# Patient Record
Sex: Female | Born: 1970 | Race: White | Hispanic: No | Marital: Married | Smoking: Former smoker
Health system: Southern US, Community
[De-identification: ages and names within clinical notes are randomized; demographics above are authoritative.]

---

## 2004-03-31 HISTORY — PX: PARTIAL HYSTERECTOMY: SHX80

## 2009-05-19 ENCOUNTER — Emergency Department (HOSPITAL_BASED_OUTPATIENT_CLINIC_OR_DEPARTMENT_OTHER): Admission: EM | Admit: 2009-05-19 | Discharge: 2009-05-19 | Payer: Self-pay | Admitting: Emergency Medicine

## 2009-05-19 ENCOUNTER — Ambulatory Visit: Payer: Self-pay | Admitting: Diagnostic Radiology

## 2009-05-24 ENCOUNTER — Emergency Department (HOSPITAL_BASED_OUTPATIENT_CLINIC_OR_DEPARTMENT_OTHER): Admission: EM | Admit: 2009-05-24 | Discharge: 2009-05-24 | Payer: Self-pay | Admitting: Emergency Medicine

## 2010-04-08 ENCOUNTER — Emergency Department (HOSPITAL_BASED_OUTPATIENT_CLINIC_OR_DEPARTMENT_OTHER)
Admission: EM | Admit: 2010-04-08 | Discharge: 2010-04-08 | Payer: Self-pay | Source: Home / Self Care | Admitting: Emergency Medicine

## 2010-06-20 LAB — PROTIME-INR
INR: 1.94 — ABNORMAL HIGH (ref 0.00–1.49)
Prothrombin Time: 22 seconds — ABNORMAL HIGH (ref 11.6–15.2)

## 2010-07-05 IMAGING — CT CT HEAD W/O CM
2 series · 16 of 30 positions shown, 18 images · non-contrast
Comparison: None.

CLINICAL DATA: Head injury, laceration

CT HEAD WITHOUT CONTRAST
TECHNIQUE: Contiguous axial images were obtained from the base of
the skull through the vertex without contrast.

[Series 2: head 4.8 h37s · axial · 0.44mm/px · z∈[-150,-36]mm · 8 of 32 slices shown, 10 images]
[im 4/32  brain]
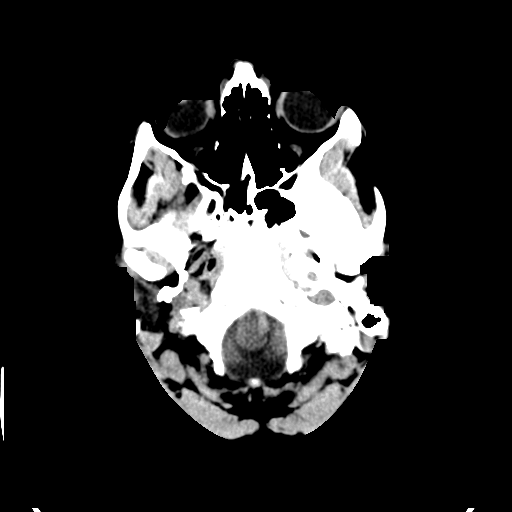
[im 4/32  bone]
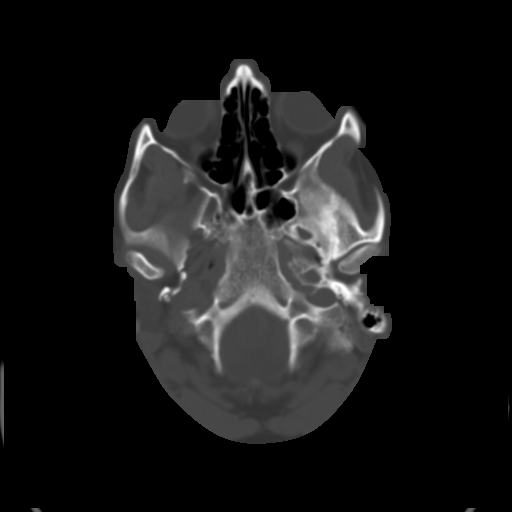
[im 7/32  brain]
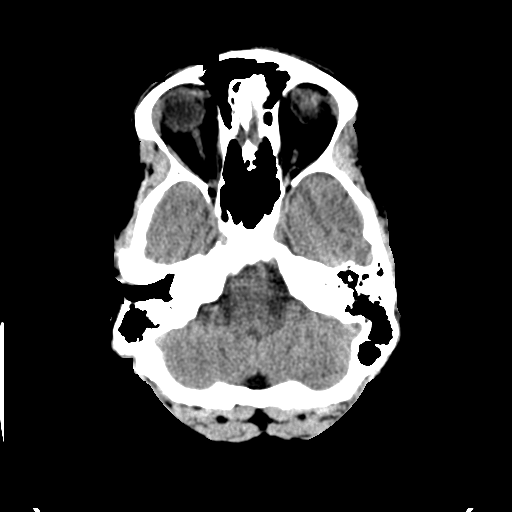
[im 11/32  brain]
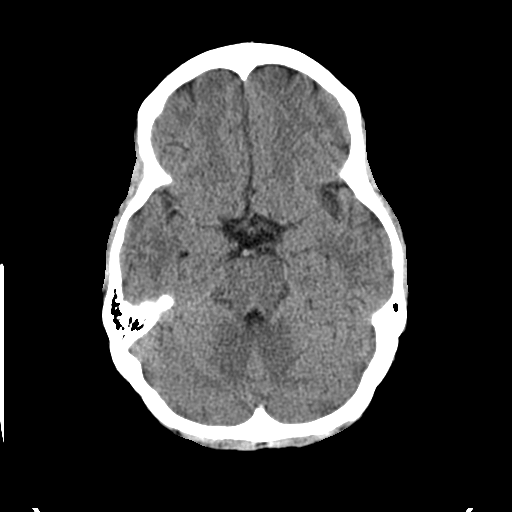
[im 14/32  brain]
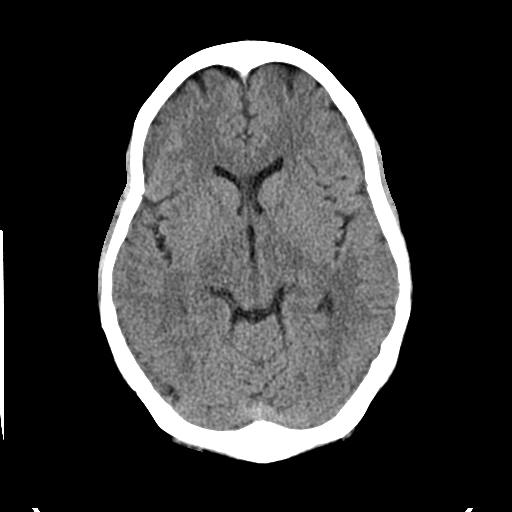
[im 18/32  brain]
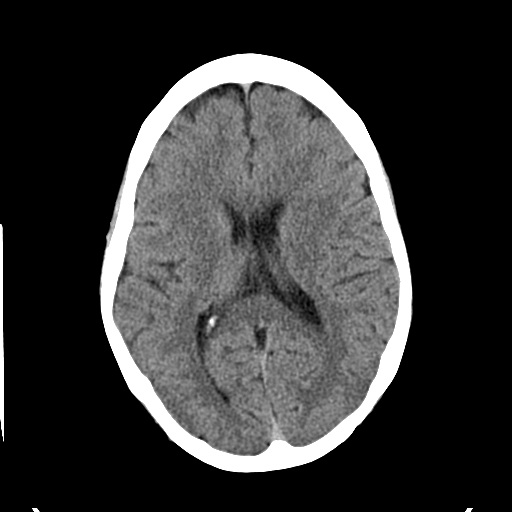
[im 18/32  bone]
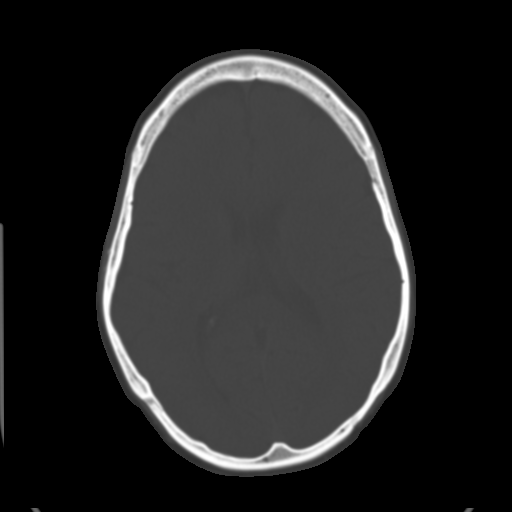
[im 21/32  brain]
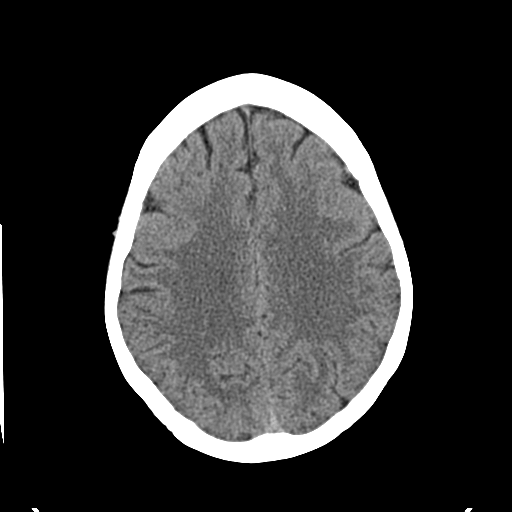
[im 25/32  brain]
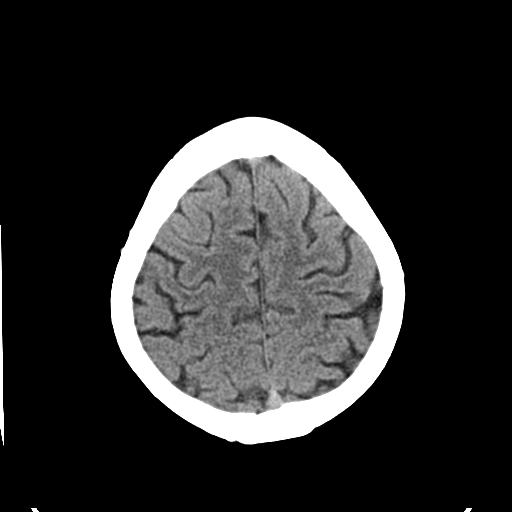
[im 28/32  brain]
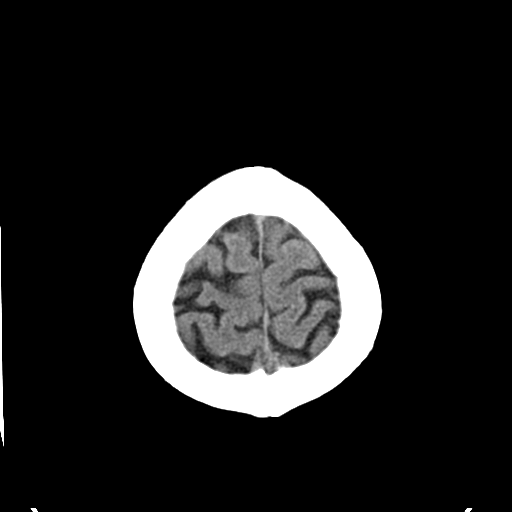

[Series 3: head 2.4 h60s bone · axial · 0.44mm/px · z∈[-151,-33]mm · 8 of 64 slices shown]
[im 7/64  bone]
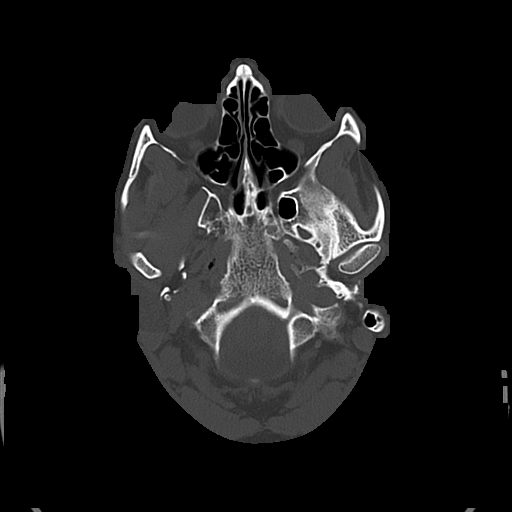
[im 14/64  bone]
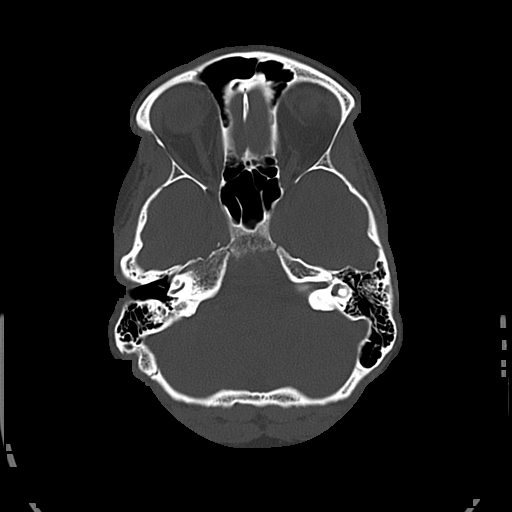
[im 20/64  bone]
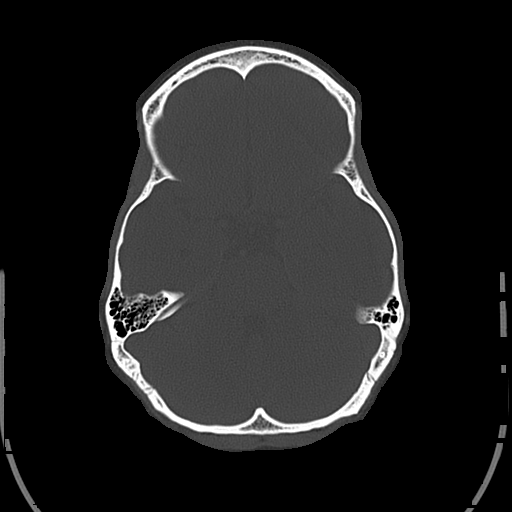
[im 27/64  bone]
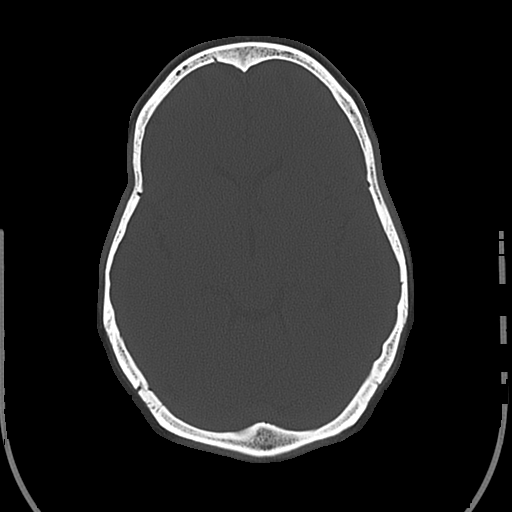
[im 37/64  bone]
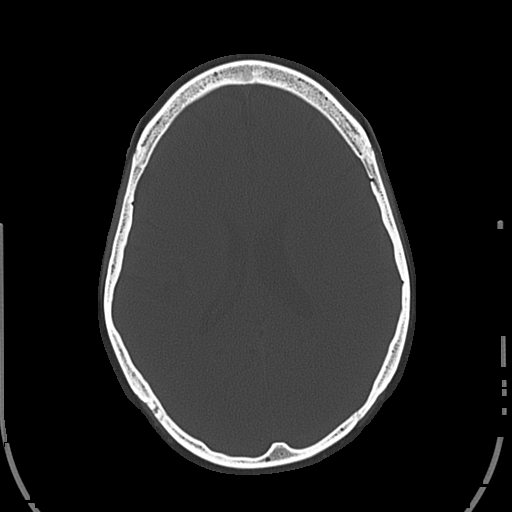
[im 44/64  bone]
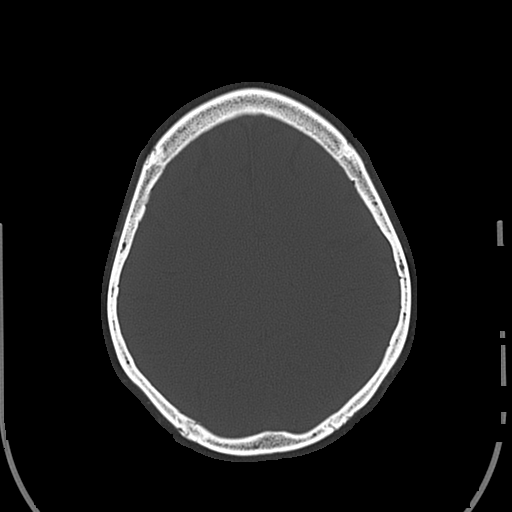
[im 50/64  bone]
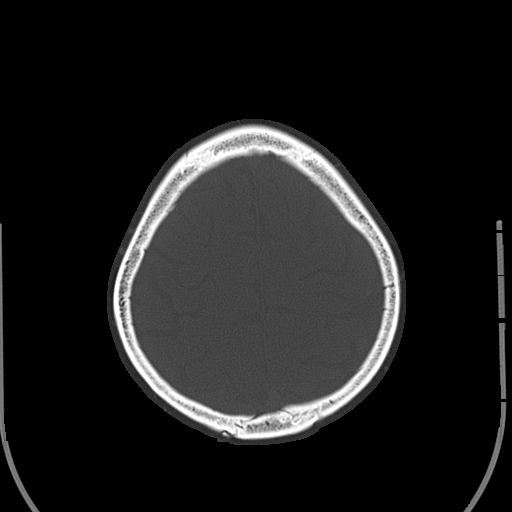
[im 57/64  bone]
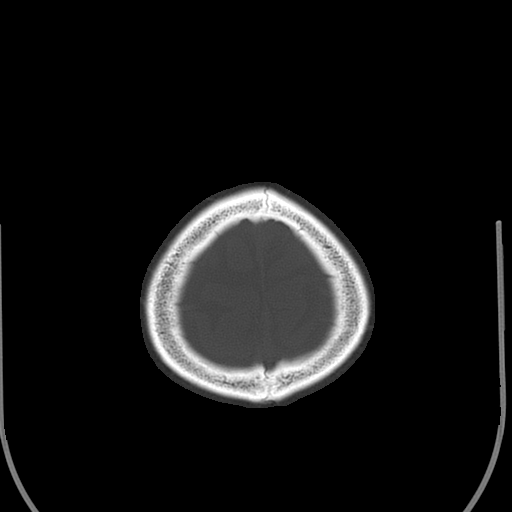

[16 of 30 positions shown; findings below may reference images not displayed]

FINDINGS: No acute intracranial hemorrhage.  No midline shift or
mass effect.  No hydrocephalus.  Bases are patent.

No evidence of skull fracture.  No evidence of significant scalp
hematoma.  Orbits are normal.  Paranasal sinuses mastoid air cells
are clear.
IMPRESSION: No evidence of intracranial trauma.

## 2021-11-20 NOTE — Progress Notes (Signed)
Cardiology Office Note:    Date:  11/22/2021   ID:  Jill Bryan, DOB 06-09-1970, MRN [id number]  PCP:  Earley Brooke., MD  Cardiologist:  Jodelle Red, MD  Referring MD: Earley Brooke., *   CC: new patient consultation for tachycardia  History of Present Illness:    Jill Bryan is a 51 y.o. female with a hx of tachycardia, hyperlipidemia, hyperparathyroidism, who is seen as a new consult at the request of Jill Bryan, Jill Bryan., * for the evaluation and management of tachycardia.  Referral notes from Dr. Ellie Bryan., personally reviewed.   She wore a cardiac event monitor, results reviewed.  She reports a prior thrombus in 2006 around the time of her hysterectomy. Hematology felt the clot was too large to have surgical etiology.  Tachycardia/palpitations: -Initial onset: 06/2021. Prior to that time, if her racing heart rates were occurring they were not noticeable. -Frequency/Duration: "All the time." Sometimes worse than other times. This morning she walked up the stairs and her Fit Bt recorded her heart rate at 147 bpm. She states her heart rate will spike with most daily activities, which is bothersome. Sometimes while sitting and watching TV her heart rate will begin racing for no reason. -Associated symptoms: Feels winded "any time I do anything." However, she would not describe this feeling as shortness of breath. -Aggravating/alleviating factors: More frequent and higher heart rate with dehydration. Typically she will stop and take deep breaths for a few minutes to calm her heart rate.  -Syncope/near syncope: None. -Prior cardiac history: None. -Prior workup: Completed treadmill stress test which was reportedly normal. Cardiac event monitor worn for 2 weeks, she has still not been able to obtain records. We reviewed the available images in the media tab. -Prior treatment: She is taking 50 mg metoprolol in the mornings. -Possible medication  interactions: Ibuprofen every once in awhile. -Caffeine: Maybe once a day, but not daily. -Alcohol:  2 glasses of wine per day during the week, up to 3 on the weekends.  -Tobacco:  Former social smoker. Quit 15 years ago. -Exercise level: Fairly active at work, Warehouse manager. No formal exercise routine. Most strenuous activity in the past month has been moving sofas, lifting objects.  -Labs: TSH, kidney function/electrolytes, CBC reviewed. -Cardiac ROS: no chest pain, no PND, no orthopnea, no LE edema. -Family history:  Her mother has hypertension, a prior stroke (49 yo) and aneurysm (49 yo); no treatment or surgery. Her father had a heart attack at 37 yo and had CABG x5 (almost 100% occlusion on all 5 blockages), and inclusive biomyositis. Her paternal grandparents died in their early mid 70's of heart attacks. Her sister is in good health.  She also endorses recent vision changes. She plans to see an ophthalmologist.  She denies any chest pain, or peripheral edema. No lightheadedness, headaches, syncope, orthopnea, or PND.  History reviewed. No pertinent past medical history.  Past Surgical History:  Procedure Laterality Date   PARTIAL HYSTERECTOMY  2006    Current Medications: Current Outpatient Medications on File Prior to Visit  Medication Sig   metoprolol succinate (TOPROL-XL) 50 MG 24 hr tablet Take 50 mg by mouth daily.   Vitamin D, Ergocalciferol, (DRISDOL) 1.25 MG (50000 UNIT) CAPS capsule Take 50,000 Units by mouth once a week.   No current facility-administered medications on file prior to visit.     Allergies:   Patient has no known allergies.   Social History   Tobacco Use  Smoking status: Former    Types: Cigarettes    Quit date: 2008    Years since quitting: 15.6   Smokeless tobacco: Never  Substance Use Topics   Alcohol use: Yes    Comment: socially   Drug use: Never    Family History: family history includes Aneurysm in her mother; Cirrhosis in her  maternal grandfather; Diabetes in her father; Emphysema in her maternal grandfather; Heart attack in her father, paternal grandfather, and paternal grandmother; Heart disease in her father; Hyperlipidemia in her father and mother; Hypertension in her father and mother; Stroke in her mother.  ROS:   Please see the history of present illness.  Additional pertinent ROS: Constitutional: Negative for chills, fever, night sweats, unintentional weight loss  HENT: Negative for ear pain and hearing loss.   Eyes: Negative for eye pain. Positive for vision changes.  Respiratory: Negative for cough, sputum, wheezing.   Cardiovascular: See HPI. Gastrointestinal: Negative for abdominal pain, melena, and hematochezia.  Genitourinary: Negative for dysuria and hematuria.  Musculoskeletal: Negative for falls and myalgias.  Skin: Negative for itching and rash.  Neurological: Negative for focal weakness, focal sensory changes and loss of consciousness.  Endo/Heme/Allergies: Does not bleed easily. Positive for bruising easily     EKGs/Labs/Other Studies Reviewed:    The following studies were reviewed today: Monitor results from referral notes of Dr. Bernette Mayers, Montez Hageman. 14 day [employer] monitor Min HR 49, AVG HR 74, max HR 144 No significant arrhythmias, ectopy <1%. 8 symptomatic events, either sinus tach/sinus rhythm  EKG:  EKG is personally reviewed.   11/22/2021:  NSR at 87 bpm  Recent Labs: No results found for requested labs within last 365 days.   Recent Lipid Panel No results found for: "CHOL", "TRIG", "HDL", "CHOLHDL", "VLDL", "LDLCALC", "LDLDIRECT"  Physical Exam:    VS:  BP 118/82 (BP Location: Left Arm, Patient Position: Sitting, Cuff Size: Normal)   Pulse 87   Ht 5\' 5"  (1.651 m)   Wt 160 lb 1.6 oz (72.6 kg)   BMI 26.64 kg/m     Wt Readings from Last 3 Encounters:  11/22/21 160 lb 1.6 oz (72.6 kg)    GEN: Well nourished, well developed in no acute distress HEENT: Normal, moist mucous  membranes NECK: No JVD CARDIAC: regular rhythm, normal S1 and S2, no rubs or gallops. No murmur. VASCULAR: Radial and DP pulses 2+ bilaterally. No carotid bruits RESPIRATORY:  Clear to auscultation without rales, wheezing or rhonchi  ABDOMEN: Soft, non-tender, non-distended MUSCULOSKELETAL:  Ambulates independently SKIN: Warm and dry, no edema NEUROLOGIC:  Alert and oriented x 3. No focal neuro deficits noted. PSYCHIATRIC:  Normal affect    ASSESSMENT:    1. Paroxysmal sinus tachycardia (HCC)   2. Heart palpitations   3. Family history of heart disease   4. Cardiac risk counseling   5. Counseling on health promotion and disease prevention    PLAN:    Palpitations Sinus tachycardia -reviewed her monitor results (contained in her referral information under the media tab) -reviewed that intermittent sinus tachycardia is usually secondary to a physiologic trigger -no high risk findings on monitor  Family history of heart disease -we reviewed the pros/cons of calcium scores. A cardiac CT scan for coronary calcium is a non-invasive way of obtaining information about the presence, location and extent of calcified plaque in the coronary arteries--the vessels that supply oxygen-containing blood to the heart muscle. Calcified plaque results when there is a build-up of fat and other substances under the  inner layer of the artery. This material can calcify which signals the presence of atherosclerosis, a disease of the vessel wall, also called coronary artery disease.  People with this disease have an increased risk for heart attacks. In addition, over time, progression of plaque build up (CAD) can narrow the arteries or even close off blood flow to the heart. Because calcium is a marker of CAD, the amount of calcium detected on a cardiac CT scan is a helpful prognostic tool.  -we reviewed the charts together which show the relationship between calcium score and 15 year all cause mortality -after  shared decision making, will proceed with coronary calcium score. They understand this is an out of pocket/self pay test currently costing $99. -if calcium score nonzero, we did preliminarily discuss statin and the data for benefit   Cardiac risk counseling and prevention recommendations: -recommend heart healthy/Mediterranean diet, with whole grains, fruits, vegetable, fish, lean meats, nuts, and olive oil. Limit salt. -recommend moderate walking, 3-5 times/week for 30-50 minutes each session. Aim for at least 150 minutes.week. Goal should be pace of 3 miles/hours, or walking 1.5 miles in 30 minutes -recommend avoidance of tobacco products. Avoid excess alcohol.   Plan for follow up: TBD based on results of testing.  Jodelle Red, MD, PhD, [facility] [place]  [facility]    Medication Adjustments/Labs and Tests Ordered: Current medicines are reviewed at length with the patient today.  Concerns regarding medicines are outlined above.   Orders Placed This Encounter  Procedures   CT CARDIAC SCORING (SELF PAY ONLY)   EKG 12-Lead   No orders of the defined types were placed in this encounter.  Patient Instructions  Medication Instructions:  Your physician has recommended you make the following change in your medication:   Change: Take 1/2 pill (25 mg) metoprolol in the morning for 6 days, then take 1/2 pill every other morning for 6 days (3 doses), then stop  *If you need a refill on your cardiac medications before your next appointment, please call your pharmacy*  Follow-Up: At [facility], you and your health needs are our priority.  As part of our continuing mission to provide you with exceptional heart care, we have created designated Provider Care Teams.  These Care Teams include your primary Cardiologist (physician) and Advanced Practice Providers (APPs -  Physician Assistants and Nurse Practitioners) who all work together to provide you with the care you need, when  you need it.  We recommend signing up for the patient portal called "MyChart".  Sign up information is provided on this After Visit Summary.  MyChart is used to connect with patients for Virtual Visits (Telemedicine).  Patients are able to view lab/test results, encounter notes, upcoming appointments, etc.  Non-urgent messages can be sent to your provider as well.   To learn more about what you can do with MyChart, go to [email or web address].    Your next appointment:   TBD based on Coronary Calcium Score- we will call you to schedule    Other Instructions Your physician has recommend you to have a coronary calcium score. This is a self pay test that will cost $99  Important Information About Sugar         I,Mathew Stumpf,acting as a scribe for Jodelle Red, MD.,have documented all relevant documentation on the behalf of Jodelle Red, MD,as directed by  Jodelle Red, MD while in the presence of Jodelle Red, MD.  I, Jodelle Red, MD, have reviewed all documentation for this  visit. The documentation on 12/22/21 for the exam, diagnosis, procedures, and orders are all accurate and complete.   Signed, Jodelle Red, MD PhD 11/22/2021     [facility]

## 2021-11-22 ENCOUNTER — Encounter (HOSPITAL_BASED_OUTPATIENT_CLINIC_OR_DEPARTMENT_OTHER): Payer: Self-pay | Admitting: Cardiology

## 2021-11-22 ENCOUNTER — Ambulatory Visit (INDEPENDENT_AMBULATORY_CARE_PROVIDER_SITE_OTHER): Payer: No Typology Code available for payment source | Admitting: Cardiology

## 2021-11-22 VITALS — BP 118/82 | HR 87 | Ht 65.0 in | Wt 160.1 lb

## 2021-11-22 DIAGNOSIS — Z8249 Family history of ischemic heart disease and other diseases of the circulatory system: Secondary | ICD-10-CM | POA: Diagnosis not present

## 2021-11-22 DIAGNOSIS — I471 Supraventricular tachycardia: Secondary | ICD-10-CM

## 2021-11-22 DIAGNOSIS — R002 Palpitations: Secondary | ICD-10-CM | POA: Diagnosis not present

## 2021-11-22 DIAGNOSIS — Z7189 Other specified counseling: Secondary | ICD-10-CM

## 2021-11-22 NOTE — Patient Instructions (Addendum)
Medication Instructions:  Your physician has recommended you make the following change in your medication:   Change: Take 1/2 pill (25 mg) metoprolol in the morning for 6 days, then take 1/2 pill every other morning for 6 days (3 doses), then stop  *If you need a refill on your cardiac medications before your next appointment, please call your pharmacy*  Follow-Up: At [facility], you and your health needs are our priority.  As part of our continuing mission to provide you with exceptional heart care, we have created designated Provider Care Teams.  These Care Teams include your primary Cardiologist (physician) and Advanced Practice Providers (APPs -  Physician Assistants and Nurse Practitioners) who all work together to provide you with the care you need, when you need it.  We recommend signing up for the patient portal called "MyChart".  Sign up information is provided on this After Visit Summary.  MyChart is used to connect with patients for Virtual Visits (Telemedicine).  Patients are able to view lab/test results, encounter notes, upcoming appointments, etc.  Non-urgent messages can be sent to your provider as well.   To learn more about what you can do with MyChart, go to [email or web address].    Your next appointment:   TBD based on Coronary Calcium Score- we will call you to schedule    Other Instructions Your physician has recommend you to have a coronary calcium score. This is a self pay test that will cost $99  Important Information About Sugar

## 2021-12-20 ENCOUNTER — Ambulatory Visit (HOSPITAL_BASED_OUTPATIENT_CLINIC_OR_DEPARTMENT_OTHER)
Admission: RE | Admit: 2021-12-20 | Discharge: 2021-12-20 | Disposition: A | Discharge status: Home / Self Care | Payer: No Typology Code available for payment source | Source: Ambulatory Visit | Attending: Cardiology | Admitting: Cardiology

## 2021-12-20 DIAGNOSIS — Z7189 Other specified counseling: Secondary | ICD-10-CM | POA: Insufficient documentation

## 2021-12-20 DIAGNOSIS — Z8249 Family history of ischemic heart disease and other diseases of the circulatory system: Secondary | ICD-10-CM | POA: Insufficient documentation

## 2021-12-20 DIAGNOSIS — I471 Supraventricular tachycardia: Secondary | ICD-10-CM | POA: Insufficient documentation

## 2021-12-22 ENCOUNTER — Encounter (HOSPITAL_BASED_OUTPATIENT_CLINIC_OR_DEPARTMENT_OTHER): Payer: Self-pay | Admitting: Cardiology

## 2021-12-23 ENCOUNTER — Encounter (HOSPITAL_BASED_OUTPATIENT_CLINIC_OR_DEPARTMENT_OTHER): Payer: Self-pay

## 2021-12-23 NOTE — Telephone Encounter (Signed)
Please advise 

## 2023-11-09 ENCOUNTER — Other Ambulatory Visit: Payer: Self-pay | Admitting: Medical Genetics

## 2024-01-20 ENCOUNTER — Other Ambulatory Visit: Payer: Self-pay | Admitting: Medical Genetics

## 2024-01-20 DIAGNOSIS — Z006 Encounter for examination for normal comparison and control in clinical research program: Secondary | ICD-10-CM

## 2024-02-16 LAB — GENECONNECT MOLECULAR SCREEN: Genetic Analysis Overall Interpretation: NEGATIVE
